# Patient Record
Sex: Male | Born: 1995
Health system: Southern US, Community
[De-identification: ages and names within clinical notes are randomized; demographics above are authoritative.]

---
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
## 2016-07-22 ENCOUNTER — Ambulatory Visit (INDEPENDENT_AMBULATORY_CARE_PROVIDER_SITE_OTHER): Payer: BLUE CROSS/BLUE SHIELD | Admitting: Physician Assistant

## 2016-07-22 VITALS — BP 150/97 | HR 74 | Temp 98.7°F | Resp 16 | Ht 68.0 in | Wt 170.0 lb

## 2016-07-22 DIAGNOSIS — Z202 Contact with and (suspected) exposure to infections with a predominantly sexual mode of transmission: Secondary | ICD-10-CM

## 2016-07-22 MED ORDER — AZITHROMYCIN 500 MG PO TABS: 1000.0000 mg | ORAL_TABLET | Freq: Every day | ORAL | 0 refills | Status: DC

## 2016-07-22 MED ORDER — CEFTRIAXONE SODIUM 250 MG IJ SOLR
250.0000 mg | Freq: Once | INTRAMUSCULAR | Status: AC
  Administered 2016-07-22: 250 mg via INTRAMUSCULAR

## 2016-07-22 NOTE — Progress Notes (Signed)
   Mitchell Richardson  MRN: [id number] DOB: 07-22-1995  PCP: No PCP Per Patient  Subjective:  Pt is a 21 year old male presents to clinic for STD check. His most recent sexual partner recently tested +positive for chlamydia. She thinks she got it from him. He uses condoms "some times". Recently got out of a long-term relationship.  Denies discharge from penis, burning with urination, abdominal pain, flank pain, n/v, fever, chills.   Review of Systems  Constitutional: Negative for chills and fever.  Gastrointestinal: Negative for abdominal pain, nausea and vomiting.  Genitourinary: Negative for difficulty urinating, discharge, dysuria, flank pain, frequency, penile pain, penile swelling, scrotal swelling and testicular pain.  Musculoskeletal: Negative for back pain.    There are no active problems to display for this patient.   No current outpatient prescriptions on file prior to visit.   No current facility-administered medications on file prior to visit.     No Known Allergies   Objective:  BP (!) 150/97 (BP Location: Right Arm, Patient Position: Sitting, Cuff Size: Normal)   Pulse 74   Temp 98.7 F (37.1 C) (Oral)   Resp 16   Ht  (1.727 m)   Wt 170 lb (77.1 kg)   SpO2 100%   BMI 25.85 kg/m   Physical Exam  Constitutional: He is oriented to person, place, and time and well-developed, well-nourished, and in no distress. No distress.  Cardiovascular: Normal rate, regular rhythm and normal heart sounds.   Neurological: He is alert and oriented to person, place, and time. GCS score is 15.  Skin: Skin is warm and dry.  Psychiatric: Mood, memory, affect and judgment normal.  Vitals reviewed.   Assessment and Plan :  1. Possible exposure to STD - GC/Chlamydia Probe Amp - cefTRIAXone (ROCEPHIN) injection 250 mg; Inject 250 mg into the muscle once. - azithromycin (ZITHROMAX) 500 MG tablet; Take 2 tablets (1,000 mg total) by mouth daily.  Dispense: 2 tablet; Refill:  0  Marco Collie, PA-C  Primary Care at [facility] 07/22/2016 1:42 PM

## 2016-07-22 NOTE — Patient Instructions (Addendum)
  PLEASE WEAR CONDOMS.   Thank you for coming in today. I hope you feel we met your needs.  Feel free to call [facility] if you have any questions or further requests.  Please consider signing up for MyChart if you do not already have it, as this is a great way to communicate with me.  Best,  Whitney McVey, PA-C  IF you received an x-ray today, you will receive an invoice from [facility]. Please contact [facility] at [phone] with questions or concerns regarding your invoice.   IF you received labwork today, you will receive an invoice from [facility]. Please contact [facility] at [phone] with questions or concerns regarding your invoice.   Our billing staff will not be able to assist you with questions regarding bills from these companies.  You will be contacted with the lab results as soon as they are available. The fastest way to get your results is to activate your My Chart account. Instructions are located on the last page of this paperwork. If you have not heard from [place] regarding the results in 2 weeks, please contact this office.

## 2016-07-23 LAB — GC/CHLAMYDIA PROBE AMP
Chlamydia trachomatis, NAA: POSITIVE — AB
Neisseria gonorrhoeae by PCR: NEGATIVE

## 2016-07-24 NOTE — Progress Notes (Signed)
Please call pt and let him know his is positive for chlamydia. We gave him one medication in the office - did he pick up his other Rx at his pharmacy? He needs to have taken both.   It is important for him to let his partner know these results. Please encourage pt to wear condoms to avoid contracting and spreading sexually transmitted diseases.  Thank you!!  Whitney

## 2016-10-08 ENCOUNTER — Ambulatory Visit (INDEPENDENT_AMBULATORY_CARE_PROVIDER_SITE_OTHER): Payer: BLUE CROSS/BLUE SHIELD | Admitting: Physician Assistant

## 2016-10-08 ENCOUNTER — Encounter: Payer: Self-pay | Admitting: Physician Assistant

## 2016-10-08 VITALS — BP 114/68 | HR 64 | Temp 99.1°F | Resp 16 | Ht 67.0 in | Wt 177.8 lb

## 2016-10-08 DIAGNOSIS — N489 Disorder of penis, unspecified: Secondary | ICD-10-CM | POA: Diagnosis not present

## 2016-10-08 DIAGNOSIS — A6002 Herpesviral infection of other male genital organs: Secondary | ICD-10-CM | POA: Diagnosis not present

## 2016-10-08 DIAGNOSIS — Z202 Contact with and (suspected) exposure to infections with a predominantly sexual mode of transmission: Secondary | ICD-10-CM

## 2016-10-08 MED ORDER — VALACYCLOVIR HCL 500 MG PO TABS: 500.0000 mg | ORAL_TABLET | Freq: Two times a day (BID) | ORAL | 6 refills | Status: DC

## 2016-10-08 MED ORDER — ACYCLOVIR 400 MG PO TABS: 400.0000 mg | ORAL_TABLET | Freq: Three times a day (TID) | ORAL | 0 refills | Status: AC

## 2016-10-08 NOTE — Progress Notes (Signed)
   Mitchell FriendsJose S Eltzroth  MRN: [id number] DOB: 1995/11/12  PCP: Patient, No Pcp Per  Subjective:  Pt is a 21 year old male who presents to clinic for bump on penis x 3 days. He first noticed 3 lesions on the shaft of his penis. Today he noticed another one on the head of his penis. This is the first time this has happened.  He is unsure of who he got it from, as he has had multiple recent partners. He does not use condoms with every sexual encounter.  Denies fever, chills, discharge from penis, burning with urination.   Review of Systems  Constitutional: Negative for chills and fever.  Genitourinary: Positive for genital sores. Negative for discharge, dysuria and penile pain.    There are no active problems to display for this patient.   No current outpatient prescriptions on file prior to visit.   No current facility-administered medications on file prior to visit.     No Known Allergies   Objective:  BP 114/68   Pulse 64   Temp 99.1 F (37.3 C) (Oral)   Resp 16   Ht 5\' 7"  (1.702 m)   Wt 177 lb 12.8 oz (80.6 kg)   SpO2 98%   BMI 27.85 kg/m   Physical Exam  Constitutional: He is oriented to person, place, and time and well-developed, well-nourished, and in no distress. No distress.  Cardiovascular: Normal rate, regular rhythm and normal heart sounds.   Genitourinary: Penis exhibits lesions (3 lesions on shaft and 2 on head of penis. TTP. No drainage. HSV culture taken. ).  Neurological: He is alert and oriented to person, place, and time. GCS score is 15.  Skin: Skin is warm and dry.  Psychiatric: Mood, memory, affect and judgment normal.  Vitals reviewed.   Assessment and Plan :  1. Lesion of penis - Herpes simplex virus culture - HSV(herpes simplex vrs) 1+2 ab-IgG - acyclovir (ZOVIRAX) 400 MG tablet; Take 1 tablet (400 mg total) by mouth 3 (three) times daily.  Dispense: 30 tablet; Refill: 0  2. Herpes genitalis in men 3. Possible exposure to STD - valACYclovir  (VALTREX) 500 MG tablet; Take 1 tablet (500 mg total) by mouth 2 (two) times daily. Take at first onset of symptoms  Dispense: 6 tablet; Refill: 6 - Suspect HSV - will treat. Culture pending. Rx written for future outbreak. Discussed at length with pt transmission of HSV, need for safe sex practices. HSV information printed out for pt. He understands.   Marco CollieWhitney Diania Co, PA-C  Primary Care at [facility] 10/08/2016 4:30 PM

## 2016-10-08 NOTE — Patient Instructions (Addendum)
For your first outbreak take Acyclovir: '400mg'$  3 times daily x 10 days. You can take Tylenol or ibuprofen for pain.  Recurrent episodes: acyclovir '800mg'$  twice daily for 5 days.    Thank you for coming in today. I hope you feel we met your needs.  Feel free to call [facility] if you have any questions or further requests.  Please consider signing up for MyChart if you do not already have it, as this is a great way to communicate with me.  Best,  Whitney McVey, PA-C  Genital Herpes Genital herpes is a common sexually transmitted infection (STI) that is caused by a virus. The virus spreads from person to person through sexual contact. Infection can cause itching, blisters, and sores around the genitals or rectum. Symptoms may last several days and then go away This is called an outbreak. However, the virus remains in your body, so you may have more outbreaks in the future. The time between outbreaks varies and can be months or years. Genital herpes affects men and women. It is particularly concerning for pregnant women because the virus can be passed to the baby during delivery and can cause serious problems. Genital herpes is also a concern for people who have a weak disease-fighting (immune) system. What are the causes? This condition is caused by the herpes simplex virus (HSV) type 1 or type 2. The virus may spread through:  Sexual contact with an infected person, including vaginal, anal, and oral sex.  Contact with fluid from a herpes sore.  The skin. This means that you can get herpes from an infected partner even if he or she does not have a visible sore or does not know that he or she is infected.  What increases the risk? You are more likely to develop this condition if:  You have sex with many partners.  You do not use latex condoms during sex.  What are the signs or symptoms? Most people do not have symptoms (asymptomatic) or have mild symptoms that may be mistaken for other skin  problems. Symptoms may include:  Small red bumps near the genitals, rectum, or mouth. These bumps turn into blisters and then turn into sores.  Flu-like symptoms, including: ? Fever. ? Body aches. ? Swollen lymph nodes. ? Headache.  Painful urination.  Pain and itching in the genital area or rectal area.  Vaginal discharge.  Tingling or shooting pain in the legs and buttocks.  Generally, symptoms are more severe and last longer during the first (primary) outbreak. Flu-like symptoms are also more common during the primary outbreak. How is this diagnosed? Genital herpes may be diagnosed based on:  A physical exam.  Your medical history.  Blood tests.  A test of a fluid sample (culture) from an open sore.  How is this treated? There is no cure for this condition, but treatment with antiviral medicines that are taken by mouth (orally) can do the following:  Speed up healing and relieve symptoms.  Help to reduce the spread of the virus to sexual partners.  Limit the chance of future outbreaks, or make future outbreaks shorter.  Lessen symptoms of future outbreaks.  Your health care provider may also recommend pain relief medicines, such as aspirin or ibuprofen. Follow these instructions at home: Sexual activity  Do not have sexual contact during active outbreaks.  Practice safe sex. Latex condoms and male condoms may help prevent the spread of the herpes virus. General instructions  Keep the affected areas dry and  clean.  Take over-the-counter and prescription medicines only as told by your health care provider.  Avoid rubbing or touching blisters and sores. If you do touch blisters or sores: ? Wash your hands thoroughly with soap and water. ? Do not touch your eyes afterward.  To help relieve pain or itching, you may take the following actions as directed by your health care provider: ? Apply a cold, wet cloth (cold compress) to affected areas 4-6 times a  day. ? Apply a substance that protects your skin and reduces bleeding (astringent). ? Apply a gel that helps relieve pain around sores (lidocaine gel). ? Take a warm, shallow bath that cleans the genital area (sitz bath).  Keep all follow-up visits as told by your health care provider. This is important. How is this prevented?  Use condoms. Although anyone can get genital herpes during sexual contact, even with the use of a condom, a condom can provide some protection.  Avoid having multiple sexual partners.  Talk with your sexual partner about any symptoms either of you may have. Also, talk with your partner about any history of STIs.  Get tested for STIs before you have sex. Ask your partner to do the same.  Do not have sexual contact if you have symptoms of genital herpes. Contact a health care provider if:  Your symptoms are not improving with medicine.  Your symptoms return.  You have new symptoms.  You have a fever.  You have abdominal pain.  You have redness, swelling, or pain in your eye.  You notice new sores on other parts of your body.  You are a woman and experience bleeding between menstrual periods.  You have had herpes and you become pregnant or plan to become pregnant. Summary  Genital herpes is a common sexually transmitted infection (STI) that is caused by the herpes simplex virus (HSV) type 1 or type 2.  These viruses are most often spread through sexual contact with an infected person.  You are more likely to develop this condition if you have sex with many partners or you have unprotected sex.  Most people do not have symptoms (asymptomatic) or have mild symptoms that may be mistaken for other skin problems. Symptoms occur as outbreaks that may happen months or years apart.  There is no cure for this condition, but treatment with oral antiviral medicines can reduce symptoms, reduce the chance of spreading the virus to a partner, prevent future  outbreaks, or shorten future outbreaks. This information is not intended to replace advice given to you by your health care provider. Make sure you discuss any questions you have with your health care provider. Document Released: 03/29/2000 Document Revised: 03/01/2016 Document Reviewed: 03/01/2016 Elsevier Interactive Patient Education  2017 [employer].   IF you received an x-ray today, you will receive an invoice from [facility]. Please contact [facility] at [phone] with questions or concerns regarding your invoice.   IF you received labwork today, you will receive an invoice from [facility]. Please contact [facility] at [phone] with questions or concerns regarding your invoice.   Our billing staff will not be able to assist you with questions regarding bills from these companies.  You will be contacted with the lab results as soon as they are available. The fastest way to get your results is to activate your My Chart account. Instructions are located on the last page of this paperwork. If you have not heard from [place] regarding the results in 2 weeks, please  contact this office.

## 2016-10-09 LAB — HSV(HERPES SIMPLEX VRS) I + II AB-IGG
HSV 1 Glycoprotein G Ab, IgG: 40.8 index — ABNORMAL HIGH (ref 0.00–0.90)
HSV 2 Glycoprotein G Ab, IgG: <0.91 index (ref 0.00–0.90)

## 2016-10-10 LAB — HERPES SIMPLEX VIRUS CULTURE

## 2016-10-11 NOTE — Progress Notes (Signed)
Please call pt and let him know the culture is positive for herpes. (This was discussed at length in OV) Please have him call if he needs refill of his medications. Thank you!

## 2016-11-15 ENCOUNTER — Ambulatory Visit (INDEPENDENT_AMBULATORY_CARE_PROVIDER_SITE_OTHER): Payer: BLUE CROSS/BLUE SHIELD | Admitting: Physician Assistant

## 2016-11-15 ENCOUNTER — Encounter: Payer: Self-pay | Admitting: Physician Assistant

## 2016-11-15 VITALS — BP 133/82 | HR 81 | Temp 99.8°F | Resp 16 | Ht 67.0 in | Wt 178.6 lb

## 2016-11-15 DIAGNOSIS — B079 Viral wart, unspecified: Secondary | ICD-10-CM

## 2016-11-15 DIAGNOSIS — A6002 Herpesviral infection of other male genital organs: Secondary | ICD-10-CM

## 2016-11-15 MED ORDER — VALACYCLOVIR HCL 500 MG PO TABS: 500.0000 mg | ORAL_TABLET | Freq: Two times a day (BID) | ORAL | 1 refills | Status: DC

## 2016-11-15 NOTE — Progress Notes (Signed)
Mitchell Richardson  MRN: [id number] DOB: 1996-03-26  Subjective:  Mitchell Richardson is a 21 y.o. male seen in office today for a chief complaint of wart on right thumb and upper lip x 2 months. Denies any pain, burning, tingling, redness, and swelling. He wants them to be removed. Has not tried anything OTC for relief. Has never had cryotherapy before.   He also needs refill for valtrex. Pt was dx with HSV 1 of the genitals in 09/2016. This was his first outbreak. Notes it resolved with valtrex. 5 days ago, the lesions reappeared on his penis. Has associated burning, tingling, and pain. Denies penile discharge, testicular pain/swelling, dysuria, and urinary frequency. He has not had sexual intercourse since the lesions reappeared. He is out of his medication and notes he does not have refills.   Review of Systems  Constitutional: Negative for chills, diaphoresis, fatigue and fever.  Gastrointestinal: Negative for abdominal pain, nausea and vomiting.  Genitourinary: Negative for decreased urine volume, difficulty urinating, flank pain, penile swelling, scrotal swelling and urgency.      Patient Active Problem List   Diagnosis Date Noted  . Herpes genitalis in men 10/08/2016    Current Outpatient Prescriptions on File Prior to Visit  Medication Sig Dispense Refill  . valACYclovir (VALTREX) 500 MG tablet Take 1 tablet (500 mg total) by mouth 2 (two) times daily. Take at first onset of symptoms 6 tablet 6   No current facility-administered medications on file prior to visit.     No Known Allergies    Social History   Social History  . Marital status: Single    Spouse name: N/A  . Number of children: N/A  . Years of education: N/A   Occupational History  . Not on file.   Social History Main Topics  . Smoking status: Current Every Day Smoker  . Smokeless tobacco: Current User    Types: Chew  . Alcohol use 1.8 oz/week    3 Standard drinks or equivalent per week  . Drug use: Yes   Types: Marijuana  . Sexual activity: No   Other Topics Concern  . Not on file   Social History Narrative  . No narrative on file    Objective:  BP 133/82   Pulse 81   Temp 99.8 F (37.7 C) (Oral)   Resp 16   Ht 5\' 7"  (1.702 m)   Wt 178 lb 9.6 oz (81 kg)   SpO2 98%   BMI 27.97 kg/m   Physical Exam  Constitutional: He is oriented to person, place, and time and well-developed, well-nourished, and in no distress.  HENT:  Head: Normocephalic and atraumatic.  Eyes: Conjunctivae are normal.  Neck: Normal range of motion.  Pulmonary/Chest: Effort normal.  Genitourinary: Penis exhibits lesions (cluster of pinpoint erythematous vesicular lesions on shaft of penis). No discharge found.  Neurological: He is alert and oriented to person, place, and time. Gait normal.  Skin: Skin is warm and dry.  One ~3-14mm grainy skin growth with thrombosed capillaries noted on lateral proximal aspect of right thumb.   One ~1-2 mm grainy skin growth noted on upper lip just inferior cupid's bow.   Psychiatric: Affect normal.  Vitals reviewed.   PROCEDURE NOTE: Cryotherapy Verbal consent obtained. Cleansed skin with alcohol prep pad. Cryotherapy applied (freeze-thaw-freeze) to wart on right thumb. The patient tolerated the procedure well.  Assessment and Plan :   1. Herpes genitalis in men -PE findings consistent with HSV outbreak. Valtrex Rx  and refills given. Return as needed.  - valACYclovir (VALTREX) 500 MG tablet; Take 1 tablet (500 mg total) by mouth 2 (two) times daily. Take at first onset of symptoms  Dispense: 20 tablet; Refill: 1  2. Viral warts, unspecified type Cryotherapy performed on wart of thumb. Will refer to derm for wart of upper lip. Pt given after care instructions.  - Ambulatory referral to Dermatology  Benjiman CoreBrittany Alisha Bacus, PA-C  [facility] at [facility] 11/15/2016 7:55 PM

## 2016-11-15 NOTE — Patient Instructions (Addendum)
For wart, keep covered. It will blister over then peel off. Return in 4 weeks if not fully gone. For lip, referral to derm has been placed. They should contact you within the next 1-2 weeks. For genital herpes, I have sent in rx for valtrex. Use condoms during outbreaks. Thank you for letting me participate in your health and well being.   Warts Warts are small growths on the skin. They are common, and they are caused by a type of germ (virus). Warts can occur on many areas of the body. A person may have one wart or more than one wart. Warts can spread if you scratch a wart and then scratch normal skin. Most warts will go away over many months to a couple years. Treatments may be done if needed. Follow these instructions at home:  Apply over-the-counter and prescription medicines only as told by your doctor.  Do not apply over-the-counter wart medicines to your face or genitals before you ask your doctor if it is okay to do that.  Do not scratch or pick at a wart.  Wash your hands after you touch a wart.  Avoid shaving hair that is over a wart.  Keep all follow-up visits as told by your doctor. This is important. Contact a doctor if:  Your warts do not improve after treatment.  You have redness, swelling, or pain at the site of a wart.  You have bleeding from a wart, and the bleeding does not stop when you put light pressure on the wart.  You have diabetes and you get a wart. This information is not intended to replace advice given to you by your health care provider. Make sure you discuss any questions you have with your health care provider. Document Released: 08/02/2010 Document Revised: 09/07/2015 Document Reviewed: 06/27/2014 Elsevier Interactive Patient Education  2018 [employer].  Genital Herpes Genital herpes is a common sexually transmitted infection (STI) that is caused by a virus. The virus spreads from person to person through sexual contact. Infection can cause  itching, blisters, and sores around the genitals or rectum. Symptoms may last several days and then go away This is called an outbreak. However, the virus remains in your body, so you may have more outbreaks in the future. The time between outbreaks varies and can be months or years. Genital herpes affects men and women. It is particularly concerning for pregnant women because the virus can be passed to the baby during delivery and can cause serious problems. Genital herpes is also a concern for people who have a weak disease-fighting (immune) system. What are the causes? This condition is caused by the herpes simplex virus (HSV) type 1 or type 2. The virus may spread through:  Sexual contact with an infected person, including vaginal, anal, and oral sex.  Contact with fluid from a herpes sore.  The skin. This means that you can get herpes from an infected partner even if he or she does not have a visible sore or does not know that he or she is infected.  What increases the risk? You are more likely to develop this condition if:  You have sex with many partners.  You do not use latex condoms during sex.  What are the signs or symptoms? Most people do not have symptoms (asymptomatic) or have mild symptoms that may be mistaken for other skin problems. Symptoms may include:  Small red bumps near the genitals, rectum, or mouth. These bumps turn into blisters and then  turn into sores.  Flu-like symptoms, including: ? Fever. ? Body aches. ? Swollen lymph nodes. ? Headache.  Painful urination.  Pain and itching in the genital area or rectal area.  Vaginal discharge.  Tingling or shooting pain in the legs and buttocks.  Generally, symptoms are more severe and last longer during the first (primary) outbreak. Flu-like symptoms are also more common during the primary outbreak. How is this diagnosed? Genital herpes may be diagnosed based on:  A physical exam.  Your medical  history.  Blood tests.  A test of a fluid sample (culture) from an open sore.  How is this treated? There is no cure for this condition, but treatment with antiviral medicines that are taken by mouth (orally) can do the following:  Speed up healing and relieve symptoms.  Help to reduce the spread of the virus to sexual partners.  Limit the chance of future outbreaks, or make future outbreaks shorter.  Lessen symptoms of future outbreaks.  Your health care provider may also recommend pain relief medicines, such as aspirin or ibuprofen. Follow these instructions at home: Sexual activity  Do not have sexual contact during active outbreaks.  Practice safe sex. Latex condoms and male condoms may help prevent the spread of the herpes virus. General instructions  Keep the affected areas dry and clean.  Take over-the-counter and prescription medicines only as told by your health care provider.  Avoid rubbing or touching blisters and sores. If you do touch blisters or sores: ? Wash your hands thoroughly with soap and water. ? Do not touch your eyes afterward.  To help relieve pain or itching, you may take the following actions as directed by your health care provider: ? Apply a cold, wet cloth (cold compress) to affected areas 4-6 times a day. ? Apply a substance that protects your skin and reduces bleeding (astringent). ? Apply a gel that helps relieve pain around sores (lidocaine gel). ? Take a warm, shallow bath that cleans the genital area (sitz bath).  Keep all follow-up visits as told by your health care provider. This is important. How is this prevented?  Use condoms. Although anyone can get genital herpes during sexual contact, even with the use of a condom, a condom can provide some protection.  Avoid having multiple sexual partners.  Talk with your sexual partner about any symptoms either of you may have. Also, talk with your partner about any history of STIs.  Get  tested for STIs before you have sex. Ask your partner to do the same.  Do not have sexual contact if you have symptoms of genital herpes. Contact a health care provider if:  Your symptoms are not improving with medicine.  Your symptoms return.  You have new symptoms.  You have a fever.  You have abdominal pain.  You have redness, swelling, or pain in your eye.  You notice new sores on other parts of your body.  You are a woman and experience bleeding between menstrual periods.  You have had herpes and you become pregnant or plan to become pregnant. Summary  Genital herpes is a common sexually transmitted infection (STI) that is caused by the herpes simplex virus (HSV) type 1 or type 2.  These viruses are most often spread through sexual contact with an infected person.  You are more likely to develop this condition if you have sex with many partners or you have unprotected sex.  Most people do not have symptoms (asymptomatic) or have mild symptoms  that may be mistaken for other skin problems. Symptoms occur as outbreaks that may happen months or years apart.  There is no cure for this condition, but treatment with oral antiviral medicines can reduce symptoms, reduce the chance of spreading the virus to a partner, prevent future outbreaks, or shorten future outbreaks. This information is not intended to replace advice given to you by your health care provider. Make sure you discuss any questions you have with your health care provider. Document Released: 03/29/2000 Document Revised: 03/01/2016 Document Reviewed: 03/01/2016 Elsevier Interactive Patient Education  2017 [employer]     IF you received an x-ray today, you will receive an invoice from [facility]. Please contact [facility] at [phone] with questions or concerns regarding your invoice.   IF you received labwork today, you will receive an invoice from [facility]. Please contact [facility] at  [phone] with questions or concerns regarding your invoice.   Our billing staff will not be able to assist you with questions regarding bills from these companies.  You will be contacted with the lab results as soon as they are available. The fastest way to get your results is to activate your My Chart account. Instructions are located on the last page of this paperwork. If you have not heard from us regarding the results in 2 weeks, please contact this office.

## 2016-11-16 ENCOUNTER — Ambulatory Visit: Payer: BLUE CROSS/BLUE SHIELD | Admitting: Physician Assistant

## 2017-03-24 IMAGING — CT CT HEAD W/O CM
3 of 4 series · 14 of 47 positions shown, 16 images · non-contrast
Comparison: None.

CLINICAL DATA: Acute onset of altered mental status. Was running
around on the street, with dilated pupils. Combative. Initial
encounter.

EXAM:
CT HEAD WITHOUT CONTRAST
TECHNIQUE: Contiguous axial images were obtained from the base of the skull
through the vertex without intravenous contrast.

[Series 2: head w/o · axial · non-contrast · 0.45mm/px · z∈[+1346,+1481]mm · 8 of 33 slices shown, 10 images]
[im 3/33  brain]
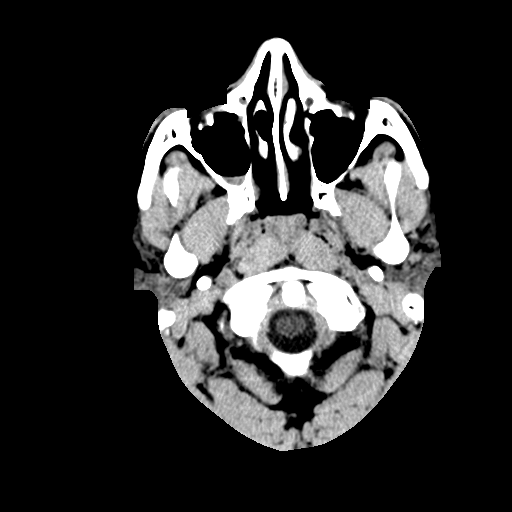
[im 3/33  bone]
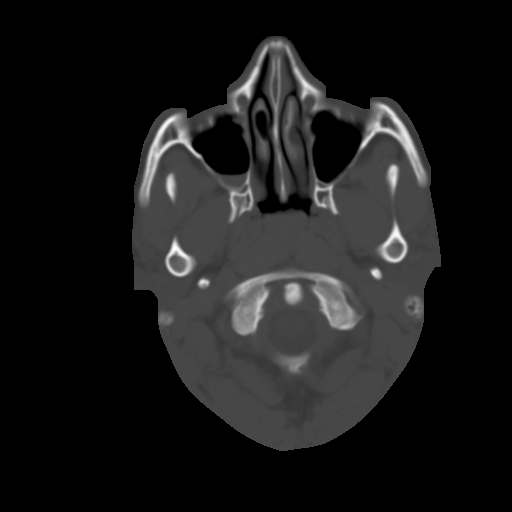
[im 7/33  brain]
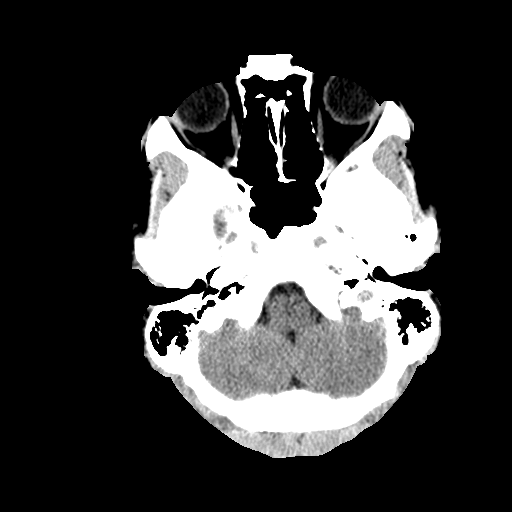
[im 12/33  brain]
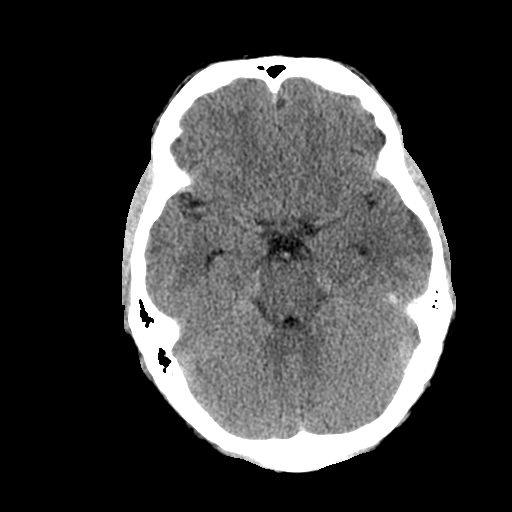
[im 14/33  brain]
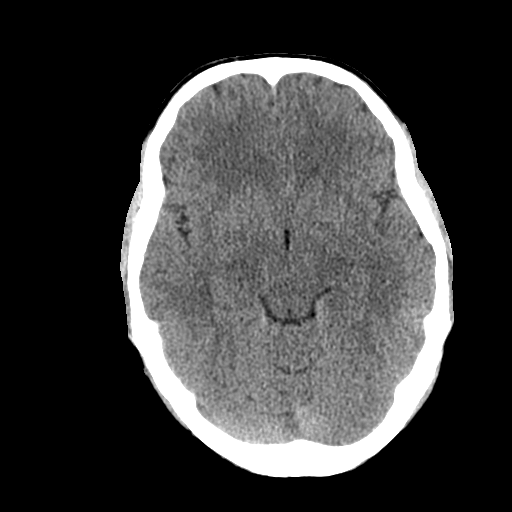
[im 19/33  brain]
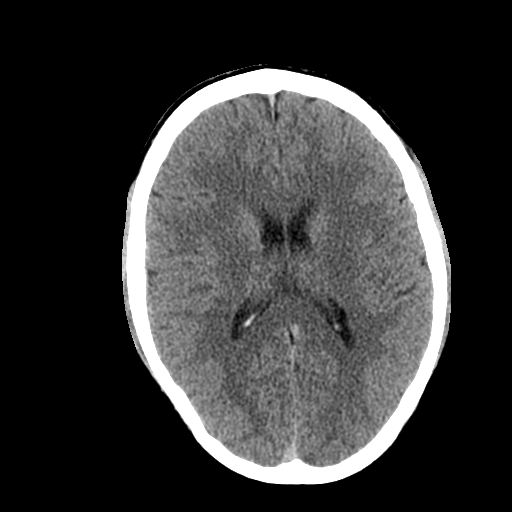
[im 19/33  bone]
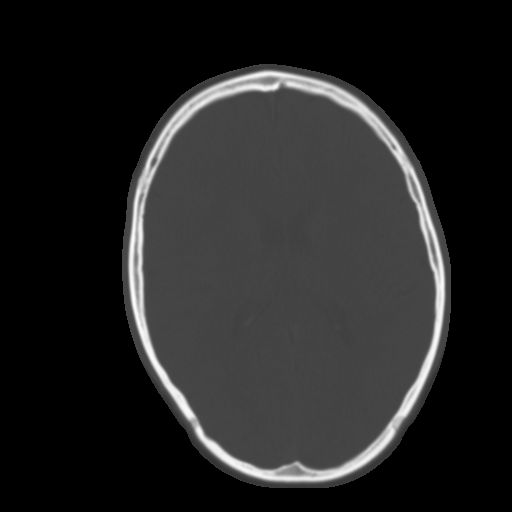
[im 21/33  brain]
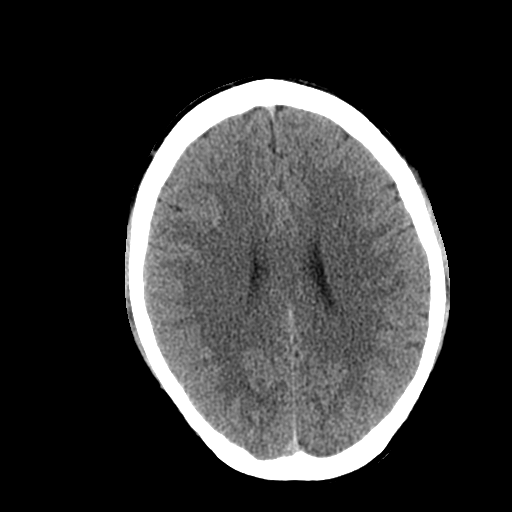
[im 26/33  brain]
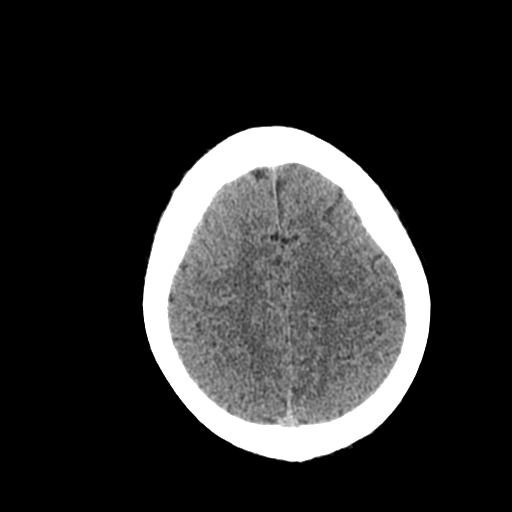
[im 30/33  brain]
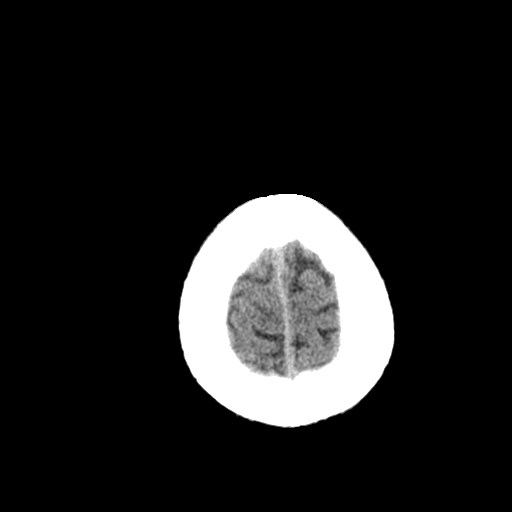

[Series 4: coronal · coronal · 0.32mm/px · 3 of 76 slices shown]
[im 26/76  brain]
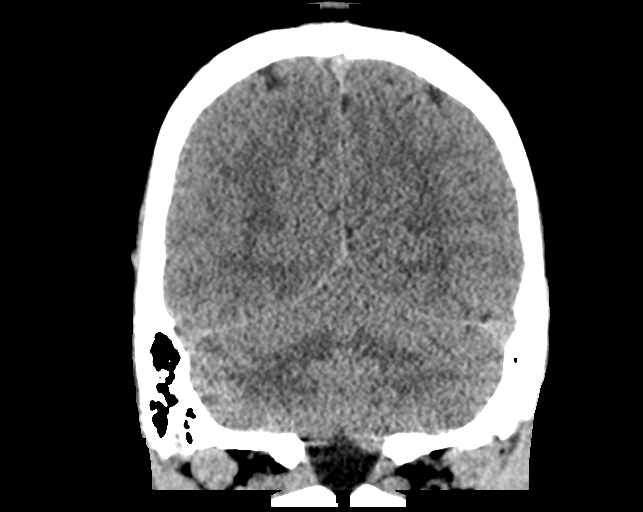
[im 34/76  brain]
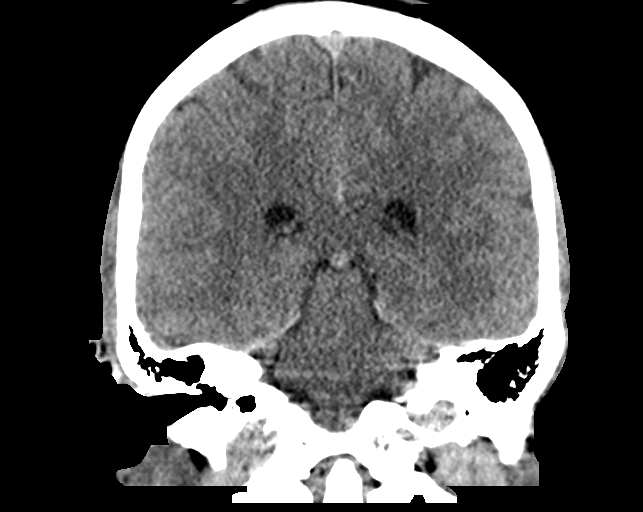
[im 42/76  brain]
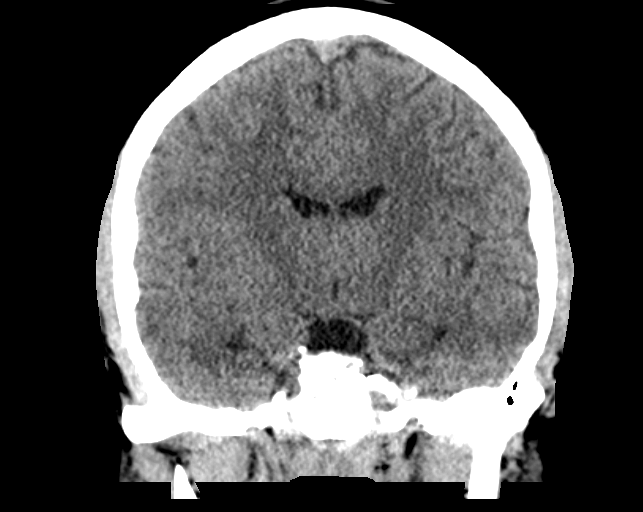

[Series 5: sagittal · sagittal · 0.32mm/px · 3 of 77 slices shown]
[im 26/77  brain]
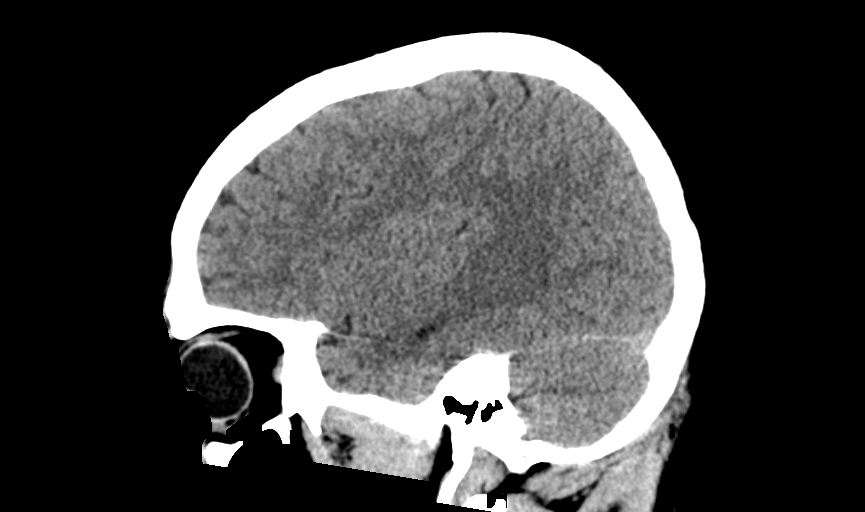
[im 39/77  brain]
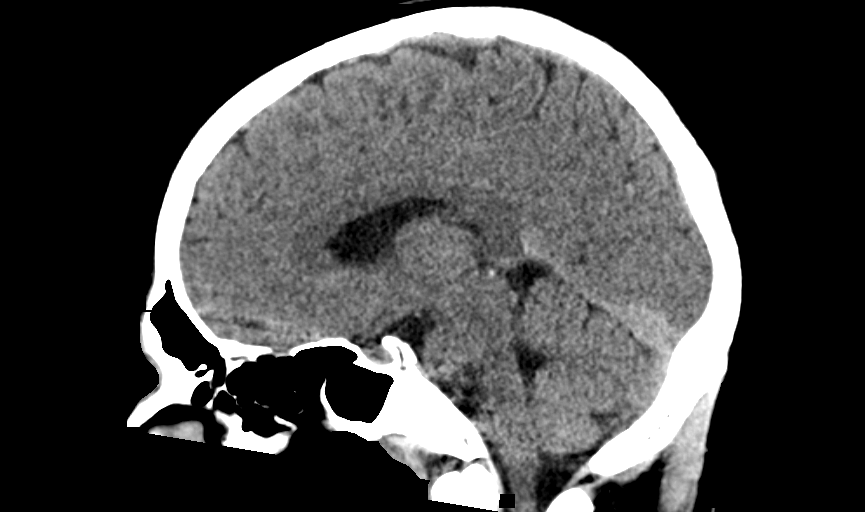
[im 51/77  brain]
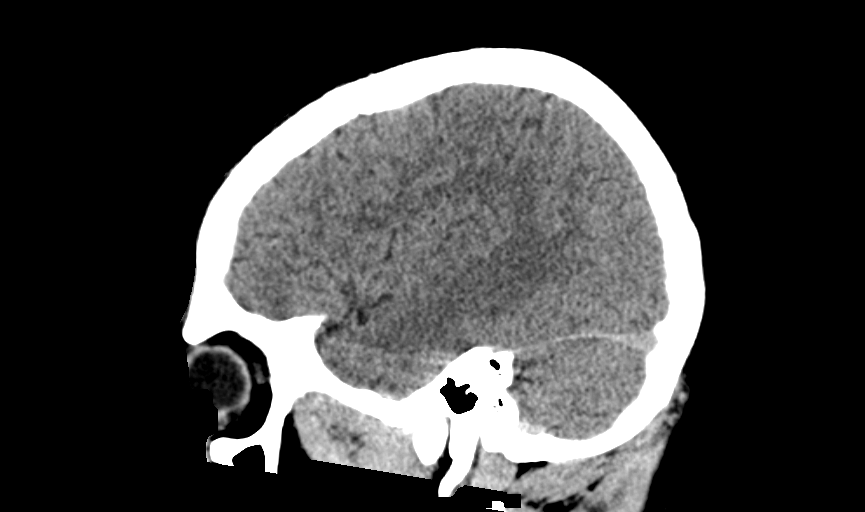

[14 of 47 positions shown; findings below may reference images not displayed]

FINDINGS: There is no evidence of acute infarction, mass lesion, or intra- or
extra-axial hemorrhage on CT.

The posterior fossa, including the cerebellum, brainstem and fourth
ventricle, is within normal limits. The third and lateral
ventricles, and basal ganglia are unremarkable in appearance. The
cerebral hemispheres are symmetric in appearance, with normal
gray-white differentiation. No mass effect or midline shift is seen.

There is no evidence of fracture; visualized osseous structures are
unremarkable in appearance. The orbits are within normal limits.
There is mild partial opacification of the right maxillary sinus.
The remaining paranasal sinuses and mastoid air cells are
well-aerated. No significant soft tissue abnormalities are seen.
IMPRESSION: 1. No acute intracranial pathology seen on CT.
2. Mild partial opacification of the right maxillary sinus.

## 2017-03-27 ENCOUNTER — Ambulatory Visit: Payer: BLUE CROSS/BLUE SHIELD | Admitting: Physician Assistant

## 2017-03-27 ENCOUNTER — Encounter: Payer: Self-pay | Admitting: Physician Assistant

## 2017-03-27 ENCOUNTER — Other Ambulatory Visit: Payer: Self-pay

## 2017-03-27 VITALS — BP 108/62 | HR 70 | Temp 97.7°F | Resp 18 | Ht 67.0 in | Wt 178.6 lb

## 2017-03-27 DIAGNOSIS — Z23 Encounter for immunization: Secondary | ICD-10-CM | POA: Diagnosis not present

## 2017-03-27 DIAGNOSIS — B889 Infestation, unspecified: Secondary | ICD-10-CM

## 2017-03-27 MED ORDER — HYDROXYZINE HCL 25 MG PO TABS: 12.5000 mg | ORAL_TABLET | Freq: Three times a day (TID) | ORAL | 0 refills | Status: DC | PRN

## 2017-03-27 MED ORDER — PERMETHRIN 5 % EX CREA: 1.0000 "application " | TOPICAL_CREAM | Freq: Once | CUTANEOUS | 0 refills | Status: AC

## 2017-03-27 NOTE — Patient Instructions (Signed)
Leave cream on from the neck down for 8-12 hours and repeat in one week. Take hyxdroxazine for itching as needed. It may make you drowsy.

## 2017-03-27 NOTE — Progress Notes (Signed)
    03/27/2017 11:51 AM   DOB: 03-Dec-1995 / MRN: [id number]  SUBJECTIVE:  Mitchell Richardson is a 21 y.o. male presenting for rash on the bilateral lower legs and hands. Has three spots on his back in a cluster as well.  Feels well other than itching and is not in any pain today     He has No Known Allergies.   He  has no past medical history on file.    He  reports that  has never smoked. His smokeless tobacco use includes chew. He reports that he drinks about 1.8 oz of alcohol per week. He reports that he uses drugs. Drug: Marijuana. He  reports that he does not engage in sexual activity. The patient  has no past surgical history on file.  His family history includes Hypertension in his father.  Review of Systems  Constitutional: Negative for chills and fever.  Respiratory: Negative for cough and shortness of breath.   Cardiovascular: Negative for chest pain.  Gastrointestinal: Negative for nausea.  Skin: Positive for itching and rash.  Neurological: Negative for dizziness.    The problem list and medications were reviewed and updated by myself where necessary and exist elsewhere in the encounter.   OBJECTIVE:  BP 108/62   Pulse 70   Temp 97.7 F (36.5 C) (Oral)   Resp 18   Ht 5\' 7"  (1.702 m)   Wt 178 lb 9.6 oz (81 kg)   SpO2 100%   BMI 27.97 kg/m   Physical Exam  Constitutional: He is active and cooperative.  Cardiovascular: Normal rate, S1 normal, S2 normal and normal pulses.  Pulmonary/Chest: Effort normal. No tachypnea.  Abdominal: He exhibits no distension.  Musculoskeletal: He exhibits no edema.  Neurological: He is alert.  Skin: Skin is warm. Rash (bites, likely scabies, one the biltareal lower legs, a small blister on the left hand and there bites on the right laterl lumboscaral area.  No tenderness. ) noted.  Vitals reviewed.   No results found for this or any previous visit (from the past 72 hour(s)).  No results found.  ASSESSMENT AND PLAN:  Mitchell Richardson was  seen today for rash.  Diagnoses and all orders for this visit:  Infestation (skin) -     permethrin (ELIMITE) 5 % cream; Apply 1 application topically once for 1 dose. -     hydrOXYzine (ATARAX/VISTARIL) 25 MG tablet; Take 0.5-2 tablets (12.5-50 mg total) by mouth 3 (three) times daily as needed. Okay to break tabs into fourths.  Need for influenza vaccination -     Flu Vaccine QUAD 36+ mos IM    The patient is advised to call or return to clinic if he does not see an improvement in symptoms, or to seek the care of the closest emergency department if he worsens with the above plan.   Deliah BostonMichael Clark, MHS, PA-C Primary Care at [facility] 03/27/2017 11:51 AM

## 2017-06-16 ENCOUNTER — Other Ambulatory Visit: Payer: Self-pay | Admitting: Urgent Care

## 2017-06-16 MED ORDER — ACYCLOVIR 400 MG PO TABS
400.0000 mg | ORAL_TABLET | Freq: Three times a day (TID) | ORAL | 5 refills | Status: DC
Start: 2017-06-16 — End: 2017-07-28

## 2017-07-04 ENCOUNTER — Ambulatory Visit: Payer: BLUE CROSS/BLUE SHIELD | Admitting: Urgent Care

## 2017-07-04 ENCOUNTER — Encounter: Payer: Self-pay | Admitting: Urgent Care

## 2017-07-04 VITALS — BP 129/76 | HR 58 | Temp 98.7°F | Resp 16 | Ht 67.0 in | Wt 169.2 lb

## 2017-07-04 DIAGNOSIS — R112 Nausea with vomiting, unspecified: Secondary | ICD-10-CM

## 2017-07-04 DIAGNOSIS — F1290 Cannabis use, unspecified, uncomplicated: Secondary | ICD-10-CM | POA: Diagnosis not present

## 2017-07-04 DIAGNOSIS — R1084 Generalized abdominal pain: Secondary | ICD-10-CM | POA: Diagnosis not present

## 2017-07-04 DIAGNOSIS — R197 Diarrhea, unspecified: Secondary | ICD-10-CM

## 2017-07-04 MED ORDER — ONDANSETRON 4 MG PO TBDP
8.0000 mg | ORAL_TABLET | Freq: Once | ORAL | Status: AC
  Administered 2017-07-04: 8 mg via ORAL

## 2017-07-04 MED ORDER — ONDANSETRON 8 MG PO TBDP: 8.0000 mg | ORAL_TABLET | Freq: Three times a day (TID) | ORAL | 0 refills | Status: DC | PRN

## 2017-07-04 NOTE — Patient Instructions (Addendum)
Cannabinoid Hyperemesis Syndrome °Cannabinoid hyperemesis syndrome (CHS) is a condition that causes repeated nausea, vomiting, and abdominal pain after long-term (chronic) use of marijuana (cannabis). People with CHS typically use marijuana 3-5 times a day for many years before they have symptoms, although it is possible to develop CHS with as little as 1 use per day. °Symptoms of CHS may be mild at first but can get worse and more frequent. In some cases, CHS may cause vomiting many times a day, which can lead to weight loss and dehydration. CHS may go away and come back many times (recur). People may not have symptoms or may otherwise be healthy in between CHS attacks. °What are the causes? °The exact cause of this condition is not known. Long-term use of marijuana may over-stimulate certain proteins in the brain that react with chemicals in marijuana (cannabinoid receptors). This over-stimulation may cause CHS. °What are the signs or symptoms? °Symptoms of this condition are often mild during the first few attacks, but they can get worse over time. Symptoms may include: °· Frequent nausea, especially early in the morning. °· Vomiting. °· Abdominal pain. ° °Taking several hot showers throughout the day can also be a sign of this condition. People with CHS may do this because it relieves symptoms. °How is this diagnosed? °This condition may be diagnosed based on: °· Your symptoms and medical history, including any drug use. °· A physical exam. ° °You may have tests done to rule out other problems. These tests may include: °· Blood tests. °· Urine tests. °· Imaging tests, such as an X-ray or CT scan. ° °How is this treated? °Treatment for this condition involves stopping marijuana use. Your health care provider may recommend: °· A drug rehabilitation program, if you have trouble stopping marijuana use. °· Medicines for nausea. °· Hot showers to help relieve symptoms. ° °Certain creams that contain a substance called  capsaicin may improve symptoms when applied to the abdomen. Ask your health care provider before starting any medicines or other treatments. °Severe nausea and vomiting may require you to stay at the hospital. You may need IV fluids to prevent or treat dehydration. You may also need certain medicines that must be given at the hospital. °Follow these instructions at home: °During an attack °· Stay in bed and rest in a dark, quiet room. °· Take anti-nausea medicine as told by your health care provider. °· Try taking hot showers to relieve your symptoms. °After an attack °· Drink small amounts of clear fluids slowly. Gradually add more. °· Once you are able to eat without vomiting, eat soft foods in small amounts every 3-4 hours. °General instructions °· Do not use any products that contain marijuana.If you need help quitting, ask your health care provider for resources and treatment options. °· Drink enough fluid to keep your urine pale yellow. Avoid drinking fluids that have a lot of sugar or caffeine, such as coffee and soda. °· Take and apply over-the-counter and prescription medicines only as told by your health care provider. Ask your health care provider before starting any new medicines or treatments. °· Keep all follow-up visits as told by your health care provider. This is important. °Contact a health care provider if: °· Your symptoms get worse. °· You cannot drink fluids without vomiting. °· You have pain and trouble swallowing after an attack. °Get help right away if: °· You cannot stop vomiting. °· You have blood in your vomit or your vomit looks like coffee grounds. °·   You have severe abdominal pain.  You have stools that are bloody or black, or stools that look like tar.  You have symptoms of dehydration, such as: ? Sunken eyes. ? Inability to make tears. ? Cracked lips. ? Dry mouth. ? Decreased urine production. ? Weakness. ? Sleepiness. ? Fainting. Summary  Cannabinoid hyperemesis  syndrome (CHS) is a condition that causes repeated nausea, vomiting, and abdominal pain after long-term use of marijuana.  People with CHS typically use marijuana 3-5 times a day for many years before they have symptoms, although it is possible to develop CHS with as little as 1 use per day.  Treatment for this condition involves stopping marijuana use. Hot showers and capsaicin creams may also help relieve symptoms. Ask your health care provider before starting any medicines or other treatments.  Your health care provider may prescribe medicines to help with nausea.  Get help right away if you have signs of dehydration, such as dry mouth, decreased urine production, or weakness. This information is not intended to replace advice given to you by your health care provider. Make sure you discuss any questions you have with your health care provider. Document Released: 07/10/2016 Document Revised: 07/10/2016 Document Reviewed: 07/10/2016 Elsevier Interactive Patient Education  2018 [employer].    Diarrhea, Adult Diarrhea is when you have loose and water poop (stool) often. Diarrhea can make you feel weak and cause you to get dehydrated. Dehydration can make you tired and thirsty, make you have a dry mouth, and make it so you pee (urinate) less often. Diarrhea often lasts 2-3 days. However, it can last longer if it is a sign of something more serious. It is important to treat your diarrhea as told by your doctor. Follow these instructions at home: Eating and drinking  Follow these recommendations as told by your doctor:  Take an oral rehydration solution (ORS). This is a drink that is sold at pharmacies and stores.  Drink clear fluids, such as: ? Water. ? Ice chips. ? Diluted fruit juice. ? Low-calorie sports drinks.  Eat bland, easy-to-digest foods in small amounts as you are able. These foods include: ? Bananas. ? Applesauce. ? Rice. ? Low-fat (lean)  meats. ? Toast. ? Crackers.  Avoid drinking fluids that have a lot of sugar or caffeine in them.  Avoid alcohol.  Avoid spicy or fatty foods.  General instructions   Drink enough fluid to keep your pee (urine) clear or pale yellow.  Wash your hands often. If you cannot use soap and water, use hand sanitizer.  Make sure that all people in your home wash their hands well and often.  Take over-the-counter and prescription medicines only as told by your doctor.  Rest at home while you get better.  Watch your condition for any changes.  Take a warm bath to help with any burning or pain from having diarrhea.  Keep all follow-up visits as told by your doctor. This is important. Contact a doctor if:  You have a fever.  Your diarrhea gets worse.  You have new symptoms.  You cannot keep fluids down.  You feel light-headed or dizzy.  You have a headache.  You have muscle cramps. Get help right away if:  You have chest pain.  You feel very weak or you pass out (faint).  You have bloody or black poop or poop that look like tar.  You have very bad pain, cramping, or bloating in your belly (abdomen).  You have trouble breathing or  you are breathing very quickly.  Your heart is beating very quickly.  Your skin feels cold and clammy.  You feel confused.  You have signs of dehydration, such as: ? Dark pee, hardly any pee, or no pee. ? Cracked lips. ? Dry mouth. ? Sunken eyes. ? Sleepiness. ? Weakness. This information is not intended to replace advice given to you by your health care provider. Make sure you discuss any questions you have with your health care provider. Document Released: 09/18/2007 Document Revised: 10/20/2015 Document Reviewed: 12/06/2014 Elsevier Interactive Patient Education  2018 [employer].   Vomiting, Adult Vomiting occurs when stomach contents are thrown up and out of the mouth. Many people notice nausea before vomiting. Vomiting can  make you feel weak and dehydrated. Dehydration can make you tired and thirsty, cause you to have a dry mouth, and decrease how often you urinate. Older adults and people who have other diseases or a weak immune system are at higher risk for dehydration.It is important to treat vomiting as told by your health care provider. Follow these instructions at home: Follow your health care provider's instructions about how to care for yourself at home. Eating and drinking Follow these recommendations as told by your health care provider:  Take an oral rehydration solution (ORS). This is a drink that is sold at pharmacies and retail stores.  Eat bland, easy-to-digest foods in small amounts as you are able. These foods include bananas, applesauce, rice, lean meats, toast, and crackers.  Drink clear fluids in small amounts as you are able. Clear fluids include water, ice chips, low-calorie sports drinks, and fruit juice that has water added (diluted fruit juice).  Avoid fluids that contain a lot of sugar or caffeine.  Avoid alcohol and foods that are spicy or fatty.  General instructions   Wash your hands frequently with soap and water. If soap and water are not available, use hand sanitizer. Make sure that everyone in your household washes their hands frequently.  Take over-the-counter and prescription medicines only as told by your health care provider.  Watch your condition for any changes.  Keep all follow-up visits as told by your health care provider. This is important. Contact a health care provider if:  You have a fever.  You are not able to keep fluids down.  Your vomiting gets worse.  You have new symptoms.  You feel light-headed or dizzy.  You have a headache.  You have muscle cramps. Get help right away if:  You have pain in your chest, neck, arm, or jaw.  You feel extremely weak or you faint.  You have persistent vomiting.  You have vomit that is bright red or looks  like black coffee grounds.  You have stools that are bloody or black, or stools that look like tar.  You have severe pain, cramping, or bloating in your abdomen.  You have a severe headache, a stiff neck, or both.  You have a rash.  You have trouble breathing or you are breathing very quickly.  Your heart is beating very quickly.  Your skin feels cold and clammy.  You feel confused.  You have pain while urinating.  You have signs of dehydration, such as: ? Dark urine, or very little or no urine. ? Cracked lips. ? Dry mouth. ? Sunken eyes. ? Sleepiness. ? Weakness. These symptoms may represent a serious problem that is an emergency. Do not wait to see if the symptoms will go away. Get medical help right  away. Call your local emergency services (911 in the [place] Do not drive yourself to the hospital. This information is not intended to replace advice given to you by your health care provider. Make sure you discuss any questions you have with your health care provider. Document Released: 04/28/2015 Document Revised: 09/07/2015 Document Reviewed: 12/06/2014 Elsevier Interactive Patient Education  2018 [employer]     IF you received an x-ray today, you will receive an invoice from [facility]. Please contact [facility] at [phone] with questions or concerns regarding your invoice.   IF you received labwork today, you will receive an invoice from [facility]. Please contact [facility] at [phone] with questions or concerns regarding your invoice.   Our billing staff will not be able to assist you with questions regarding bills from these companies.  You will be contacted with the lab results as soon as they are available. The fastest way to get your results is to activate your My Chart account. Instructions are located on the last page of this paperwork. If you have not heard from us regarding the results in 2 weeks, please contact this office.     ,

## 2017-07-04 NOTE — Progress Notes (Signed)
    MRN: [id number] DOB: July 18, 1995  Subjective:   Mitchell Richardson is a 22 y.o. male presenting for 1 week history of nausea with vomiting, loose stools, mild abdominal pain. Denies fever, chest pain, hematemesis, diarrhea, bloody stools. Drinks alcohol occasionally. Smokes marijuana ~4 times weekly.   Mitchell Richardson has a current medication list which includes the following prescription(s): acyclovir. Also has No Known Allergies.  Mitchell Richardson denies past medical and surgical history.  Objective:   Vitals: BP 129/76   Pulse (!) 58   Temp 98.7 F (37.1 C) (Oral)   Resp 16   Ht 5\' 7"  (1.702 m)   Wt 169 lb 3.2 oz (76.7 kg)   SpO2 98%   BMI 26.50 kg/m   Wt Readings from Last 3 Encounters:  07/04/17 169 lb 3.2 oz (76.7 kg)  03/27/17 178 lb 9.6 oz (81 kg)  11/15/16 178 lb 9.6 oz (81 kg)    Physical Exam  Constitutional: He is oriented to person, place, and time. He appears well-developed and well-nourished.  HENT:  Mucous membranes dry.  Eyes: No scleral icterus.  Neck: Normal range of motion. Neck supple.  Cardiovascular: Normal rate, regular rhythm and intact distal pulses. Exam reveals no gallop and no friction rub.  No murmur heard. Pulmonary/Chest: No respiratory distress. He has no wheezes. He has no rales.  Abdominal: Soft. Bowel sounds are normal. He exhibits no distension and no mass. There is no tenderness. There is no guarding.  Neurological: He is alert and oriented to person, place, and time.  Skin: Skin is warm and dry.  Psychiatric: He has a normal mood and affect.   Assessment and Plan :   Non-intractable vomiting with nausea, unspecified vomiting type - Plan: Comprehensive metabolic panel, CBC, ondansetron (ZOFRAN-ODT) disintegrating tablet 8 mg  Diarrhea, unspecified type - Plan: Stool culture, CBC  Generalized abdominal pain - Plan: HELICOBACTER PYLORI  ANTIBODY, IGM  Marijuana use  Labs, stool culture pending. Counseled patient on cannabis induced hyperemesis syndrome  and recommended he stop using marijuana. Supportive care recommended otherwise. Return-to-clinic precautions discussed, patient verbalized understanding.   Wallis BambergMario Carrie Usery, PA-C Primary Care at [facility] [phone] 07/04/2017  12:06 PM

## 2017-07-07 LAB — CBC
HEMOGLOBIN: 16.2 g/dL (ref 13.0–17.7)
Hematocrit: 47.3 % (ref 37.5–51.0)
MCH: 30.7 pg (ref 26.6–33.0)
MCHC: 34.2 g/dL (ref 31.5–35.7)
MCV: 90 fL (ref 79–97)
PLATELETS: 243 10*3/uL (ref 150–379)
RBC: 5.28 x10E6/uL (ref 4.14–5.80)
RDW: 13.1 % (ref 12.3–15.4)
WBC: 5 10*3/uL (ref 3.4–10.8)

## 2017-07-07 LAB — HELICOBACTER PYLORI  ANTIBODY, IGM

## 2017-07-07 LAB — COMPREHENSIVE METABOLIC PANEL
A/G RATIO: 1.8 (ref 1.2–2.2)
ALT: 22 IU/L (ref 0–44)
AST: 28 IU/L (ref 0–40)
Albumin: 4.9 g/dL (ref 3.5–5.5)
Alkaline Phosphatase: 93 IU/L (ref 39–117)
BILIRUBIN TOTAL: 0.3 mg/dL (ref 0.0–1.2)
BUN/Creatinine Ratio: 16 (ref 9–20)
BUN: 15 mg/dL (ref 6–20)
CALCIUM: 10 mg/dL (ref 8.7–10.2)
CHLORIDE: 103 mmol/L (ref 96–106)
CO2: 23 mmol/L (ref 20–29)
Creatinine, Ser: 0.95 mg/dL (ref 0.76–1.27)
GFR calc Af Amer: 133 mL/min/{1.73_m2} (ref >59)
GFR calc non Af Amer: 115 mL/min/{1.73_m2} (ref >59)
GLOBULIN, TOTAL: 2.7 g/dL (ref 1.5–4.5)
Glucose: 91 mg/dL (ref 65–99)
POTASSIUM: 4 mmol/L (ref 3.5–5.2)
SODIUM: 143 mmol/L (ref 134–144)
Total Protein: 7.6 g/dL (ref 6.0–8.5)

## 2017-07-08 LAB — STOOL CULTURE: E coli, Shiga toxin Assay: NEGATIVE

## 2017-07-28 ENCOUNTER — Other Ambulatory Visit: Payer: Self-pay | Admitting: Urgent Care

## 2017-07-28 MED ORDER — ACYCLOVIR 400 MG PO TABS: 400.0000 mg | ORAL_TABLET | Freq: Three times a day (TID) | ORAL | 5 refills | Status: DC

## 2017-09-29 ENCOUNTER — Ambulatory Visit: Payer: BLUE CROSS/BLUE SHIELD | Admitting: Family Medicine

## 2017-09-29 ENCOUNTER — Other Ambulatory Visit: Payer: Self-pay

## 2017-09-29 ENCOUNTER — Encounter: Payer: Self-pay | Admitting: Family Medicine

## 2017-09-29 VITALS — BP 116/66 | HR 102 | Temp 99.1°F | Resp 18 | Ht 67.0 in | Wt 186.4 lb

## 2017-09-29 DIAGNOSIS — J069 Acute upper respiratory infection, unspecified: Secondary | ICD-10-CM | POA: Diagnosis not present

## 2017-09-29 MED ORDER — LORATADINE 10 MG PO TABS: 10.0000 mg | ORAL_TABLET | Freq: Every day | ORAL | 11 refills | Status: AC

## 2017-09-29 MED ORDER — MOMETASONE FUROATE 50 MCG/ACT NA SUSP: 2.0000 | Freq: Every day | NASAL | 12 refills | Status: AC

## 2017-09-29 NOTE — Patient Instructions (Addendum)
   IF you received an x-ray today, you will receive an invoice from [facility]. Please contact [facility] at [phone] with questions or concerns regarding your invoice.   IF you received labwork today, you will receive an invoice from [facility]. Please contact [facility] at [phone] with questions or concerns regarding your invoice.   Our billing staff will not be able to assist you with questions regarding bills from these companies.  You will be contacted with the lab results as soon as they are available. The fastest way to get your results is to activate your My Chart account. Instructions are located on the last page of this paperwork. If you have not heard from us regarding the results in 2 weeks, please contact this office.     Viral Respiratory Infection A viral respiratory infection is an illness that affects parts of the body used for breathing, like the lungs, nose, and throat. It is caused by a germ called a virus. Some examples of this kind of infection are:  A cold.  The flu (influenza).  A respiratory syncytial virus (RSV) infection.  How do I know if I have this infection? Most of the time this infection causes:  A stuffy or runny nose.  Yellow or green fluid in the nose.  A cough.  Sneezing.  Tiredness (fatigue).  Achy muscles.  A sore throat.  Sweating or chills.  A fever.  A headache.  How is this infection treated? If the flu is diagnosed early, it may be treated with an antiviral medicine. This medicine shortens the length of time a person has symptoms. Symptoms may be treated with over-the-counter and prescription medicines, such as:  Expectorants. These make it easier to cough up mucus.  Decongestant nasal sprays.  Doctors do not prescribe antibiotic medicines for viral infections. They do not work with this kind of infection. How do I know if I should stay home? To keep others from getting sick, stay home if you  have:  A fever.  A lasting cough.  A sore throat.  A runny nose.  Sneezing.  Muscles aches.  Headaches.  Tiredness.  Weakness.  Chills.  Sweating.  An upset stomach (nausea).  Follow these instructions at home:  Rest as much as possible.  Take over-the-counter and prescription medicines only as told by your doctor.  Drink enough fluid to keep your pee (urine) clear or pale yellow.  Gargle with salt water. Do this 3-4 times per day or as needed. To make a salt-water mixture, dissolve -1 tsp of salt in 1 cup of warm water. Make sure the salt dissolves all the way.  Use nose drops made from salt water. This helps with stuffiness (congestion). It also helps soften the skin around your nose.  Do not drink alcohol.  Do not use tobacco products, including cigarettes, chewing tobacco, and e-cigarettes. If you need help quitting, ask your doctor. Get help if:  Your symptoms last for 10 days or longer.  Your symptoms get worse over time.  You have a fever.  You have very bad pain in your face or forehead.  Parts of your jaw or neck become very swollen. Get help right away if:  You feel pain or pressure in your chest.  You have shortness of breath.  You faint or feel like you will faint.  You keep throwing up (vomiting).  You feel confused. This information is not intended to replace advice given to you by your health care provider.   Make sure you discuss any questions you have with your health care provider. Document Released: 03/14/2008 Document Revised: 09/07/2015 Document Reviewed: 09/07/2014 Elsevier Interactive Patient Education  2018 [employer] Inc.  

## 2017-09-29 NOTE — Progress Notes (Signed)
Chief Complaint  Patient presents with  . congestion x 4 weeks    yellow mucus drainage, tried vic's and other otc meds with no relief    HPI   Pt reports that he had waxing waning sore throat  He states that he had cough and congestion that started 4 weeks ago He had some yellow mucus drainage He tried vicks vapor rub  He denies sick contacts He reports some runny nose No history of asthma He is a nonsmoker  He denies productive cough Denies exposure to mono No myalgias   No past medical history on file.  Current Outpatient Medications  Medication Sig Dispense Refill  . loratadine (CLARITIN) 10 MG tablet Take 1 tablet (10 mg total) by mouth daily. 30 tablet 11  . mometasone (NASONEX) 50 MCG/ACT nasal spray Place 2 sprays into the nose daily. 17 g 12   No current facility-administered medications for this visit.     Allergies: No Known Allergies  No past surgical history on file.  Social History   Socioeconomic History  . Marital status: Single    Spouse name: Not on file  . Number of children: Not on file  . Years of education: Not on file  . Highest education level: Not on file  Occupational History  . Not on file  Social Needs  . Financial resource strain: Not on file  . Food insecurity:    Worry: Not on file    Inability: Not on file  . Transportation needs:    Medical: Not on file    Non-medical: Not on file  Tobacco Use  . Smoking status: Never Smoker  . Smokeless tobacco: Current User    Types: Chew  Substance and Sexual Activity  . Alcohol use: Yes    Alcohol/week: 1.8 oz    Types: 3 Standard drinks or equivalent per week  . Drug use: Yes    Types: Marijuana  . Sexual activity: Never    Birth control/protection: Abstinence  Lifestyle  . Physical activity:    Days per week: Not on file    Minutes per session: Not on file  . Stress: Not on file  Relationships  . Social connections:    Talks on phone: Not on file    Gets together: Not  on file    Attends religious service: Not on file    Active member of club or organization: Not on file    Attends meetings of clubs or organizations: Not on file    Relationship status: Not on file  Other Topics Concern  . Not on file  Social History Narrative  . Not on file    Family History  Problem Relation Age of Onset  . Hypertension Father      ROS Review of Systems See HPI Constitution: No fevers or chills No malaise No diaphoresis Skin: No rash or itching Eyes: no blurry vision, no double vision GU: no dysuria or hematuria Neuro: no dizziness or headaches all others reviewed and negative   Objective: Vitals:   09/29/17 1421  BP: 116/66  Pulse: (!) 102  Resp: 18  Temp: 99.1 F (37.3 C)  TempSrc: Oral  SpO2: 97%  Weight: 186 lb 6.4 oz (84.6 kg)  Height: 5\' 7"  (1.702 m)    Physical Exam General: alert, oriented, in NAD Head: normocephalic, atraumatic, no sinus tenderness Eyes: EOM intact, no scleral icterus or conjunctival injection Ears: TM clear bilaterally Nose: mucosa nonerythematous, nonedematous Throat: no pharyngeal exudate or erythema Lymph:  no posterior auricular, submental or cervical lymph adenopathy Heart: normal rate, normal sinus rhythm, no murmurs Lungs: clear to auscultation bilaterally, no wheezing   Assessment and Plan Bodi was seen today for congestion x 4 weeks.  Diagnoses and all orders for this visit:  Acute URI  Other orders -     mometasone (NASONEX) 50 MCG/ACT nasal spray; Place 2 sprays into the nose daily. -     loratadine (CLARITIN) 10 MG tablet; Take 1 tablet (10 mg total) by mouth daily.   Supportive care Hydration No abx indication  Advised against using qtips to clear ear canals (his had NO wax)   Shayda Kalka A Creta LevinStallings

## 2017-10-28 ENCOUNTER — Ambulatory Visit: Payer: BLUE CROSS/BLUE SHIELD | Admitting: Emergency Medicine

## 2017-10-28 DIAGNOSIS — Z79899 Other long term (current) drug therapy: Secondary | ICD-10-CM | POA: Diagnosis not present

## 2017-10-28 DIAGNOSIS — A549 Gonococcal infection, unspecified: Secondary | ICD-10-CM | POA: Diagnosis not present

## 2017-10-28 DIAGNOSIS — R350 Frequency of micturition: Secondary | ICD-10-CM | POA: Diagnosis not present

## 2017-10-28 DIAGNOSIS — Z202 Contact with and (suspected) exposure to infections with a predominantly sexual mode of transmission: Secondary | ICD-10-CM | POA: Diagnosis not present

## 2017-10-28 DIAGNOSIS — Z7251 High risk heterosexual behavior: Secondary | ICD-10-CM | POA: Diagnosis not present

## 2017-11-02 DIAGNOSIS — A749 Chlamydial infection, unspecified: Secondary | ICD-10-CM | POA: Diagnosis not present

## 2017-11-02 DIAGNOSIS — Z202 Contact with and (suspected) exposure to infections with a predominantly sexual mode of transmission: Secondary | ICD-10-CM | POA: Diagnosis not present

## 2017-12-23 ENCOUNTER — Ambulatory Visit: Payer: BLUE CROSS/BLUE SHIELD | Admitting: Urgent Care

## 2017-12-23 ENCOUNTER — Other Ambulatory Visit: Payer: Self-pay

## 2017-12-23 ENCOUNTER — Encounter: Payer: Self-pay | Admitting: Urgent Care

## 2017-12-23 VITALS — BP 121/72 | HR 76 | Temp 98.8°F | Resp 16 | Ht 67.0 in | Wt 181.0 lb

## 2017-12-23 DIAGNOSIS — Z202 Contact with and (suspected) exposure to infections with a predominantly sexual mode of transmission: Secondary | ICD-10-CM | POA: Diagnosis not present

## 2017-12-23 DIAGNOSIS — R4582 Worries: Secondary | ICD-10-CM | POA: Diagnosis not present

## 2017-12-23 MED ORDER — AZITHROMYCIN 500 MG PO TABS: 1000.0000 mg | ORAL_TABLET | Freq: Every day | ORAL | 0 refills | Status: DC

## 2017-12-23 NOTE — Patient Instructions (Addendum)
Chlamydia, Male Chlamydia is an STD (sexually transmitted disease). It is a bacterial infection that spreads through sexual contact (is contagious). Chlamydia can occur in different areas of the body, including the tube that moves urine from the bladder out of the body (urethra), the throat, or the rectum. This condition is not difficult to treat. However, if left untreated, chlamydia can lead to more serious health problems. What are the causes? Chlamydia is caused by the bacteria Chlamydia trachomatis. It is passed from an infected partner during sexual activity. Chlamydia can spread through contact with the genitals, mouth, or rectum. What are the signs or symptoms? In some cases, there may not be any symptoms for this condition (asymptomatic), especially early in the infection. If symptoms develop, they may include:  Burning when urinating.  Urinating frequently.  Pain or swelling in the testicles.  Watery, mucus-like discharge from the penis.  Redness, soreness, and swelling (inflammation) of the rectum.  Bleeding or discharge from the rectum.  Abdominal pain.  Itching, burning, or redness in the eyes, or discharge from the eyes.  How is this diagnosed? This condition may be diagnosed based on:  Urine tests.  Swab tests. Depending on your symptoms, your health care provider may use a cotton swab to collect discharge from your urethra or rectum to test for the bacteria.  How is this treated? This condition is treated with antibiotic medicines. Follow these instructions at home: Medicines  Take over-the-counter and prescription medicines only as told by your health care provider.  Take your antibiotic medicine as told by your health care provider. Do not stop taking the antibiotic even if you start to feel better. Sexual activity  Tell sexual partners about your infection. This includes any oral, anal, or vaginal sex partners you have had within 60 days of when your  symptoms started. Sexual partners should also be treated, even if they have no signs of the disease.  Do not have sex until you and your sexual partners have completed treatment and your health care provider says it is okay. If your health care provider prescribed you a single dose treatment, wait 7 days after taking the treatment before having sex. General instructions  It is your responsibility to get your test results. Ask your health care provider, or the department performing the test, when your results will be ready.  Get plenty of rest.  Eat a healthy, well-balanced diet.  Drink enough fluids to keep your urine clear or pale yellow.  Keep all follow-up visits as told by your health care provider. This is important. You may need to be tested for infection again 3 months after treatment. How is this prevented? The only sure way to prevent chlamydia is to avoid sexual intercourse. However, you can lower your risk by:  Using latex condoms correctly every time you have sexual intercourse.  Not having multiple sexual partners.  Asking if your sexual partner has been tested for STIs and had negative results.  Contact a health care provider if:  You develop new symptoms or your symptoms do not get better after completing treatment.  You have a fever or chills.  You have pain during sexual intercourse.  You develop new joint pain or swelling near your joints.  You have pain or soreness in your testicles. Get help right away if:  Your pain gets worse and does not get better with medicine.  You have abnormal discharge.  You develop flu-like symptoms, such as night sweats, sore throat, or muscle   aches. Summary  Chlamydia is an STD (sexually transmitted disease). It is a bacterial infection that spreads (is contagious) through sexual contact.  This condition is not difficult to treat, however, if left untreated, it can lead to more serious health problems.  In some cases,  there may not be any symptoms for this condition (asymptomatic).  This condition is treated with antibiotic medicines.  Using latex condoms correctly every time you have sexual intercourse can help prevent chlamydia. This information is not intended to replace advice given to you by your health care provider. Make sure you discuss any questions you have with your health care provider. Document Released: 04/01/2005 Document Revised: 03/18/2016 Document Reviewed: 03/18/2016 Elsevier Interactive Patient Education  [employer].     If you have lab work done today you will be contacted with your lab results within the next 2 weeks.  If you have not heard from [place] then please contact us. The fastest way to get your results is to register for My Chart.   IF you received an x-ray today, you will receive an invoice from [facility]. Please contact [facility] at [phone] with questions or concerns regarding your invoice.   IF you received labwork today, you will receive an invoice from [facility]. Please contact [facility] at [phone] with questions or concerns regarding your invoice.   Our billing staff will not be able to assist you with questions regarding bills from these companies.  You will be contacted with the lab results as soon as they are available. The fastest way to get your results is to activate your My Chart account. Instructions are located on the last page of this paperwork. If you have not heard from [place] regarding the results in 2 weeks, please contact this office.

## 2017-12-23 NOTE — Progress Notes (Signed)
   MRN: [id number] DOB: December 24, 1995  Subjective:   Mitchell Richardson is a 22 y.o. male presenting for treatment for chlamydia.  Patient reports that he had sex with a new partner and that tested positive for chlamydia, although the test was negative.  Patient denies any symptoms.  Mitchell Richardson is not currently taking any medications. Also has No Known Allergies.   Objective:   Vitals: BP 121/72   Pulse 76   Temp 98.8 F (37.1 C) (Oral)   Resp 16   Ht 5\' 7"  (1.702 m)   Wt 181 lb (82.1 kg)   SpO2 98%   BMI 28.35 kg/m   Physical Exam  Cardiovascular: Normal rate.   Assessment and Plan :   Exposure to chlamydia - Plan: Trichomonas vaginalis, RNA, GC/Chlamydia Probe Amp([facility]), CANCELED: HIV antibody, CANCELED: RPR  Worries  Labs pending, treated empirically with azithromycin. Counseled patient on potential for adverse effects with medications prescribed today, patient verbalized understanding. Return-to-clinic precautions discussed, patient verbalized understanding.   Wallis Bamberg, PA-C [facility] [phone] 12/23/2017  10:12 AM

## 2017-12-24 LAB — GC/CHLAMYDIA PROBE AMP
CHLAMYDIA, DNA PROBE: POSITIVE — AB
NEISSERIA GONORRHOEAE BY PCR: NEGATIVE

## 2017-12-24 LAB — TRICHOMONAS VAGINALIS, PROBE AMP: TRICH VAG BY NAA: NEGATIVE

## 2018-01-01 ENCOUNTER — Other Ambulatory Visit: Payer: Self-pay | Admitting: Urgent Care

## 2018-01-01 DIAGNOSIS — A749 Chlamydial infection, unspecified: Secondary | ICD-10-CM

## 2018-01-01 MED ORDER — DOXYCYCLINE HYCLATE 100 MG PO CAPS: 100.0000 mg | ORAL_CAPSULE | Freq: Two times a day (BID) | ORAL | 0 refills | Status: AC

## 2018-07-05 ENCOUNTER — Other Ambulatory Visit: Payer: Self-pay | Admitting: Urgent Care

## 2019-07-15 ENCOUNTER — Ambulatory Visit: Payer: Self-pay

## 2019-09-27 ENCOUNTER — Ambulatory Visit (HOSPITAL_COMMUNITY): Admission: EM | Admit: 2019-09-27 | Discharge: 2019-09-27 | Discharge status: Against Medical Advice | Payer: Self-pay

## 2019-09-27 ENCOUNTER — Other Ambulatory Visit: Payer: Self-pay
